# Patient Record
Sex: Female | Born: 2005 | Race: Black or African American | Hispanic: No | Marital: Single | Smoking: Never smoker
Health system: Southern US, Community
[De-identification: ages and names within clinical notes are randomized; demographics above are authoritative.]

## PROBLEM LIST (undated history)

## (undated) DIAGNOSIS — D649 Anemia, unspecified: Secondary | ICD-10-CM

---
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
## 2023-03-28 NOTE — Progress Notes (Deleted)
   HPI: Ms.Stacey Gillespie is a 17 y.o. female, who is here today to establish care.  Former PCP: *** Last preventive routine visit: ***  Chronic medical problems: ***  Concerns today: ***  Review of Systems See other pertinent positives and negatives in HPI.  Current Outpatient Medications on File Prior to Visit  Medication Sig Dispense Refill   ferrous sulfate 325 (65 FE) MG tablet Take 1 tablet (325 mg total) by mouth daily with breakfast. 30 tablet 3   No current facility-administered medications on file prior to visit.    Past Medical History:  Diagnosis Date   Anemia    No Known Allergies  No family history on file.  Social History   Socioeconomic History   Marital status: Single    Spouse name: Not on file   Number of children: Not on file   Years of education: Not on file   Highest education level: Not on file  Occupational History   Not on file  Tobacco Use   Smoking status: Never   Smokeless tobacco: Never  Substance and Sexual Activity   Alcohol use: Not on file   Drug use: Not on file   Sexual activity: Not on file  Other Topics Concern   Not on file  Social History Narrative   Not on file   Social Determinants of Health   Financial Resource Strain: Not on file  Food Insecurity: Not on file  Transportation Needs: Not on file  Physical Activity: Not on file  Stress: Not on file  Social Connections: Not on file    There were no vitals filed for this visit.  There is no height or weight on file to calculate BMI.  Physical Exam  ASSESSMENT AND PLAN: There are no diagnoses linked to this encounter.  There are no diagnoses linked to this encounter.  No follow-ups on file.  Betty G. [place], MD  [facility]. [place] office.

## 2023-03-31 ENCOUNTER — Ambulatory Visit: Payer: Commercial Managed Care - HMO | Admitting: Family Medicine

## 2023-04-15 ENCOUNTER — Ambulatory Visit: Payer: Commercial Managed Care - HMO | Admitting: Obstetrics and Gynecology
